# Patient Record
Sex: Female | Born: 2000 | Race: White | Hispanic: No | Smoking: Never smoker
Health system: Southern US, Community
[De-identification: ages and names within clinical notes are randomized; demographics above are authoritative.]

## PROBLEM LIST (undated history)

## (undated) DIAGNOSIS — T7840XA Allergy, unspecified, initial encounter: Secondary | ICD-10-CM

## (undated) HISTORY — PX: INNER EAR SURGERY: SHX679

## (undated) HISTORY — PX: TYMPANOSTOMY TUBE PLACEMENT: SHX32

## (undated) HISTORY — PX: OTHER SURGICAL HISTORY: SHX169

## (undated) HISTORY — DX: Allergy, unspecified, initial encounter: T78.40XA

---
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
## 2019-02-07 ENCOUNTER — Other Ambulatory Visit: Payer: Self-pay

## 2019-02-07 ENCOUNTER — Ambulatory Visit (INDEPENDENT_AMBULATORY_CARE_PROVIDER_SITE_OTHER): Payer: Commercial Managed Care - PPO | Admitting: Family

## 2019-02-07 ENCOUNTER — Encounter: Payer: Self-pay | Admitting: Family

## 2019-02-07 VITALS — BP 120/68 | HR 134 | Temp 98.4°F | Resp 16 | Ht 66.0 in | Wt 196.0 lb

## 2019-02-07 DIAGNOSIS — Z23 Encounter for immunization: Secondary | ICD-10-CM

## 2019-02-07 DIAGNOSIS — R000 Tachycardia, unspecified: Secondary | ICD-10-CM

## 2019-02-07 DIAGNOSIS — Z0000 Encounter for general adult medical examination without abnormal findings: Secondary | ICD-10-CM

## 2019-02-07 DIAGNOSIS — Z0001 Encounter for general adult medical examination with abnormal findings: Secondary | ICD-10-CM | POA: Diagnosis not present

## 2019-02-07 DIAGNOSIS — R030 Elevated blood-pressure reading, without diagnosis of hypertension: Secondary | ICD-10-CM

## 2019-02-07 NOTE — Patient Instructions (Addendum)
Please complete lab work prior to leaving. Please work on healthy low fat diet, regular exercise and weight loss.

## 2019-02-07 NOTE — Addendum Note (Signed)
Addended by: Jiles Prows on: 02/07/2019 02:47 PM   Modules accepted: Orders

## 2019-02-07 NOTE — Progress Notes (Signed)
Subjective:    Patient ID: Amber Whitney, female    DOB: Jul 14, 2001, 18 y.o.   MRN: [id number]  HPI  Patient presents today for complete physical.  Immunizations:  Up to date, declines gardisil. Would like meningococcal vaccine Diet:trying to eat healthy Wt Readings from Last 3 Encounters:  02/07/19 196 lb (88.9 kg) (97 %, Z= 1.90)*  11/19/17 186 lb 6.4 oz (84.6 kg) (96 %, Z= 1.79)*  08/20/16 167 lb (75.8 kg) (93 %, Z= 1.51)*   * Growth percentiles are based on CDC (Girls, 2-20 Years) data.  Exercise: works at [employer]  Pap Smear:  Will begin at age 69- not sexually active.  Vision: 2-3 weeks ago Dental:  1 month ago  Review of Systems  Constitutional: Positive for unexpected weight change.  HENT: Negative for hearing loss and rhinorrhea.   Eyes: Negative for visual disturbance.  Respiratory: Negative for cough.   Cardiovascular: Negative for leg swelling.  Gastrointestinal: Negative for blood in stool and diarrhea.  Genitourinary: Negative for dysuria, frequency and hematuria.  Musculoskeletal: Negative for arthralgias and myalgias.  Skin: Negative for rash.  Neurological: Negative for headaches.  Hematological: Negative for adenopathy.  Psychiatric/Behavioral:       Denies depression/anxiety   Past Medical History:  Diagnosis Date  . Allergy      Social History   Socioeconomic History  . Marital status: Single    Spouse name: Not on file  . Number of children: Not on file  . Years of education: Not on file  . Highest education level: Not on file  Occupational History  . Not on file  Social Needs  . Financial resource strain: Not on file  . Food insecurity    Worry: Not on file    Inability: Not on file  . Transportation needs    Medical: Not on file    Non-medical: Not on file  Tobacco Use  . Smoking status: Never Smoker  . Smokeless tobacco: Never Used  Substance and Sexual Activity  . Alcohol use: No    Alcohol/week: 0.0 standard drinks  . Drug  use: No  . Sexual activity: Not on file  Lifestyle  . Physical activity    Days per week: Not on file    Minutes per session: Not on file  . Stress: Not on file  Relationships  . Social Herbalist on phone: Not on file    Gets together: Not on file    Attends religious service: Not on file    Active member of club or organization: Not on file    Attends meetings of clubs or organizations: Not on file    Relationship status: Not on file  . Intimate partner violence    Fear of current or ex partner: Not on file    Emotionally abused: Not on file    Physically abused: Not on file    Forced sexual activity: Not on file  Other Topics Concern  . Not on file  Social History Narrative   Enjoys volleyball   Goes to [employer] group   Has one sister   Lives with parents   Rising freshman at [employer]   One dog.      Past Surgical History:  Procedure Laterality Date  . INNER EAR SURGERY Left    tubes in both ears and reconstructive surgery of left ear.    Family History  Problem Relation Age of Onset  . Migraines Mother   .  Hyperlipidemia Father   . Diabetes Paternal Uncle   . Heart disease Paternal Grandfather   . Stroke Paternal Grandfather   . Hypertension Paternal Grandfather   . Diabetes Maternal Grandfather     No Known Allergies  Current Outpatient Medications on File Prior to Visit  Medication Sig Dispense Refill  . cetirizine (ZYRTEC) 10 MG tablet Take 10 mg by mouth daily.    . pantoprazole (PROTONIX) 40 MG tablet TAKE 1 TABLET (40 MG TOTAL) BY MOUTH DAILY. (Patient not taking: Reported on 11/19/2017) 30 tablet 5  . rizatriptan (MAXALT-MLT) 5 MG disintegrating tablet Take 1 tablet (5 mg total) by mouth as needed for migraine. May repeat in 2 hours if needed (Patient not taking: Reported on 11/19/2017) 10 tablet 0  . sertraline (ZOLOFT) 50 MG tablet TAKE 1/2 TABLET BY MOUTH ONCE DAILY X1 WEEK THEN INCREASE TO 1 TABLET ONCE DAILY ON WEEK 2 (Patient not  taking: Reported on 11/19/2017) 30 tablet 5   No current facility-administered medications on file prior to visit.     BP 120/68   Pulse (!) 134   Temp 98.4 F (36.9 C) (Oral)   Resp 16   Ht 5\' 6"  (1.676 m)   Wt 196 lb (88.9 kg)   LMP 01/24/2019   SpO2 99%   BMI 31.64 kg/m       Objective:   Physical Exam  Physical Exam  Constitutional: She is oriented to person, place, and time. She appears well-developed and well-nourished. No distress.  HENT:  Head: Normocephalic and atraumatic.  Right Ear: Tympanic membrane and ear canal normal.  Left Ear: Tympanic membrane and ear canal normal.  Mouth/Throat: not examined.  Eyes: Pupils are equal, round, and reactive to light. No scleral icterus.  Neck: Normal range of motion. No thyromegaly present.  Cardiovascular: + tachycardia noted No murmur heard. Pulmonary/Chest: Effort normal and breath sounds normal. No respiratory distress. He has no wheezes. She has no rales. She exhibits no tenderness.  Abdominal: Soft. Bowel sounds are normal. She exhibits no distension and no mass. There is no tenderness. There is no rebound and no guarding.  Musculoskeletal: She exhibits no edema.  Lymphadenopathy:    She has no cervical adenopathy.  Neurological: She is alert and oriented to person, place, and time. She has normal patellar reflexes. She exhibits normal muscle tone. Coordination normal.  Skin: Skin is warm and dry.  Psychiatric: She has a normal mood and affect. Her behavior is normal. Judgment and thought content normal.  Breast/pelvic: deferred           Assessment & Plan:   Preventative care- discussed importance of healthy diet, exercise and weight loss.  Menveo #2 today. Would like bexero next visit. Obtain routine lab work.   Elevated blood pressure reading- repeat BP OK.  Will plan to recheck in 6 months.    Tachycardia- HR 134 upon arrival. Repeat HR 124.  EKG Is performed and personally reviewed. Notes sinus  tachycardia- she notes feeling mildly nervous which may be contributing. Note is also made of short PRI (110 msec). Will refer to cardiology.      Assessment & Plan:

## 2019-02-08 LAB — CBC WITH DIFFERENTIAL/PLATELET
Basophils Absolute: 0.1 10*3/uL (ref 0.0–0.1)
Basophils Relative: 1.1 % (ref 0.0–3.0)
Eosinophils Absolute: 0.2 10*3/uL (ref 0.0–0.7)
Eosinophils Relative: 1.6 % (ref 0.0–5.0)
HCT: 42.1 % (ref 36.0–49.0)
Hemoglobin: 13.9 g/dL (ref 12.0–16.0)
Lymphocytes Relative: 22.6 % — ABNORMAL LOW (ref 24.0–48.0)
Lymphs Abs: 2.6 10*3/uL (ref 0.7–4.0)
MCHC: 33 g/dL (ref 31.0–37.0)
MCV: 84.3 fl (ref 78.0–98.0)
Monocytes Absolute: 0.6 10*3/uL (ref 0.1–1.0)
Monocytes Relative: 5.5 % (ref 3.0–12.0)
Neutro Abs: 7.9 10*3/uL — ABNORMAL HIGH (ref 1.4–7.7)
Neutrophils Relative %: 69.2 % (ref 43.0–71.0)
Platelets: 273 10*3/uL (ref 150.0–575.0)
RBC: 5 Mil/uL (ref 3.80–5.70)
RDW: 13.3 % (ref 11.4–15.5)
WBC: 11.4 10*3/uL (ref 4.5–13.5)

## 2019-02-08 LAB — LIPID PANEL
Cholesterol: 157 mg/dL (ref 0–200)
HDL: 60.5 mg/dL (ref >39.00)
LDL Cholesterol: 74 mg/dL (ref 0–99)
NonHDL: 96.03
Total CHOL/HDL Ratio: 3
Triglycerides: 110 mg/dL (ref 0.0–149.0)
VLDL: 22 mg/dL (ref 0.0–40.0)

## 2019-02-08 LAB — BASIC METABOLIC PANEL
BUN: 14 mg/dL (ref 6–23)
CO2: 24 mEq/L (ref 19–32)
Calcium: 10.1 mg/dL (ref 8.4–10.5)
Chloride: 104 mEq/L (ref 96–112)
Creatinine, Ser: 0.71 mg/dL (ref 0.40–1.20)
GFR: 106.01 mL/min (ref >60.00)
Glucose, Bld: 117 mg/dL — ABNORMAL HIGH (ref 70–99)
Potassium: 4.3 mEq/L (ref 3.5–5.1)
Sodium: 138 mEq/L (ref 135–145)

## 2019-02-08 LAB — TSH: TSH: 2.65 u[IU]/mL (ref 0.40–5.00)

## 2019-02-08 LAB — HEPATIC FUNCTION PANEL
ALT: 24 U/L (ref 0–35)
AST: 15 U/L (ref 0–37)
Albumin: 4.8 g/dL (ref 3.5–5.2)
Alkaline Phosphatase: 74 U/L (ref 47–119)
Bilirubin, Direct: 0.1 mg/dL (ref 0.0–0.3)
Total Bilirubin: 0.4 mg/dL (ref 0.2–1.2)
Total Protein: 7.4 g/dL (ref 6.0–8.3)

## 2019-02-09 ENCOUNTER — Other Ambulatory Visit (INDEPENDENT_AMBULATORY_CARE_PROVIDER_SITE_OTHER): Payer: Commercial Managed Care - PPO

## 2019-02-09 DIAGNOSIS — R739 Hyperglycemia, unspecified: Secondary | ICD-10-CM | POA: Diagnosis not present

## 2019-02-09 LAB — HEMOGLOBIN A1C: Hgb A1c MFr Bld: 5.3 % (ref 4.6–6.5)

## 2019-02-10 ENCOUNTER — Encounter: Payer: Self-pay | Admitting: Family

## 2019-02-13 ENCOUNTER — Telehealth: Payer: Self-pay | Admitting: Family

## 2019-02-13 DIAGNOSIS — R000 Tachycardia, unspecified: Secondary | ICD-10-CM

## 2019-02-13 DIAGNOSIS — R9431 Abnormal electrocardiogram [ECG] [EKG]: Secondary | ICD-10-CM

## 2019-02-13 NOTE — Telephone Encounter (Signed)
Talked to patient's mother and advised her  per [facility] the referral was submitted last week and she can give them a call to schedule Phone number given to patient's mother. Advised to let us know if she is not able to contact or doesn't hear from them by Wednesday

## 2019-02-13 NOTE — Telephone Encounter (Signed)
Please let mother know that we are working on this and we apologize for the delay.  Please let me know if she has not heard from cardiology by the end of the day on Wednesday.

## 2019-02-13 NOTE — Telephone Encounter (Signed)
Mother checking on the status of Cardiology referral, mother states referral was discussed at 02/07/2019 appointment. Mother would like a follow up call today, please advise  Deneane Stifter tel [phone]

## 2019-02-13 NOTE — Telephone Encounter (Signed)
Please advise 

## 2019-02-15 DIAGNOSIS — R9431 Abnormal electrocardiogram [ECG] [EKG]: Secondary | ICD-10-CM | POA: Insufficient documentation

## 2019-02-15 NOTE — Progress Notes (Signed)
Cardiology Office Note:    Date:  02/16/2019   ID:  Amber Whitney, DOB 03/01/2001, MRN [id number]  PCP:  Sandford Craze'Sullivan, Melissa, NP  Cardiologist:  Norman HerrlichBrian , MD   Referring MD: Sandford Craze'Sullivan, Melissa, NP  ASSESSMENT:    1. Sinus tachycardia   2. Abnormal EKG    PLAN:    In order of problems listed above:  1. General routine visit was not to have tachycardia EKG showed sinus tach 124 bpm with accelerated AV nodal conduction.  Today's EKG shows a rate of 90 and a normal PR interval.  Patient and her mother are very concerned about the tachycardia there is no obvious etiology her hemoglobin and thyroid is normal she is taking no over-the-counter medicines to accelerate her rate and after a nice discussion and shared decision making she will undergo echocardiogram to exclude cardiomyopathy structural heart disease and a 3-day ZIO monitor to assess her heart rates.  She relates she is less physically active than COVID-19 and this may be a reflection of deconditioning.  Next appointment 6 weeks   Medication Adjustments/Labs and Tests Ordered: Current medicines are reviewed at length with the patient today.  Concerns regarding medicines are outlined above.  No orders of the defined types were placed in this encounter.  No orders of the defined types were placed in this encounter.    Chief Complaint  Patient presents with  . Abnormal ECG    History of Present Illness:    Amber Whitney is a 18 y.o. female who is being seen today for the evaluation of an abnormal EKG at the request of Sandford Craze'Sullivan, Melissa, NP. Office EKG 02/07/2019 shows sinus tachycardia accelerated AV nodal conduction without preexcitation and biatrial enlargement. She has no history of heart disease heart murmur and has had no shortness of breath palpitations syncope or chest pain.  Her uncle had a myocardial infarction and died in his 7140s and her great-grandmother had rheumatic fever.  She takes no over-the-counter  proarrhythmic drugs and evaluation shows a normal hemoglobin normal thyroid studies and takes no proarrhythmic medications.  After a nice discussion with the patient and her mother and shared decision making she will further evaluation including echocardiogram to exclude structural heart disease and 3-day ZIO monitor to assess heart rhythm and heart rate.  I told him my expectation that these will be normal and have not restricted activity.  Accelerated AV nodal conduction does not put her at risk for sustained tachyarrhythmia.  There is no evidence of preexcitation on the EKG Past Medical History:  Diagnosis Date  . Allergy     Past Surgical History:  Procedure Laterality Date  . INNER EAR SURGERY Left    tubes in both ears and reconstructive surgery of left ear.    Current Medications: Current Meds  Medication Sig  . cetirizine (ZYRTEC) 10 MG tablet Take 10 mg by mouth daily.     Allergies:   Patient has no known allergies.   Social History   Socioeconomic History  . Marital status: Single    Spouse name: Not on file  . Number of children: Not on file  . Years of education: Not on file  . Highest education level: Not on file  Occupational History  . Not on file  Social Needs  . Financial resource strain: Not on file  . Food insecurity    Worry: Not on file    Inability: Not on file  . Transportation needs    Medical: Not on file  Non-medical: Not on file  Tobacco Use  . Smoking status: Never Smoker  . Smokeless tobacco: Never Used  Substance and Sexual Activity  . Alcohol use: No    Alcohol/week: 0.0 standard drinks  . Drug use: No  . Sexual activity: Not on file  Lifestyle  . Physical activity    Days per week: Not on file    Minutes per session: Not on file  . Stress: Not on file  Relationships  . Social Herbalist on phone: Not on file    Gets together: Not on file    Attends religious service: Not on file    Active member of club or  organization: Not on file    Attends meetings of clubs or organizations: Not on file    Relationship status: Not on file  Other Topics Concern  . Not on file  Social History Narrative   Enjoys volleyball   Goes to [employer] group   Has one sister   Lives with parents   Rising freshman at [employer]   One dog.       Family History: The patient's family history includes Diabetes in her maternal grandfather and paternal uncle; Heart disease in her paternal grandfather; Hyperlipidemia in her father; Hypertension in her paternal grandfather; Migraines in her mother; Stroke in her paternal grandfather.  ROS:   Review of Systems  Eyes: Negative.   Cardiovascular: Negative.   Respiratory: Negative.   Endocrine: Negative.    Please see the history of present illness.     All other systems reviewed and are negative.  EKGs/Labs/Other Studies Reviewed:    The following studies were reviewed today:   EKG:  EKG is  ordered today.  The ekg ordered today is personally reviewed and demonstrates [place] 90 BPM normal for age  Recent Labs: 02/07/2019: ALT 24; BUN 14; Creatinine, Ser 0.71; Hemoglobin 13.9; Platelets 273.0; Potassium 4.3; Sodium 138; TSH 2.65  Recent Lipid Panel    Component Value Date/Time   CHOL 157 02/07/2019 1426   TRIG 110.0 02/07/2019 1426   HDL 60.50 02/07/2019 1426   CHOLHDL 3 02/07/2019 1426   VLDL 22.0 02/07/2019 1426   LDLCALC 74 02/07/2019 1426   LDLCALC 64 11/19/2017 1444    Physical Exam:    VS:  BP 112/78 (BP Location: Left Arm, Patient Position: Sitting, Cuff Size: Large)   Pulse 90   Temp 98.4 F (36.9 C)   Ht 5\' 6"  (1.676 m)   Wt 198 lb 12.8 oz (90.2 kg)   LMP 01/24/2019   BMI 32.09 kg/m     Wt Readings from Last 3 Encounters:  02/16/19 198 lb 12.8 oz (90.2 kg) (97 %, Z= 1.94)*  02/07/19 196 lb (88.9 kg) (97 %, Z= 1.90)*  11/19/17 186 lb 6.4 oz (84.6 kg) (96 %, Z= 1.79)*   * Growth percentiles are based on CDC (Girls, 2-20 Years) data.      GEN:  Well nourished, well developed in no acute distress HEENT: Normal NECK: No JVD; No carotid bruits LYMPHATICS: No lymphadenopathy CARDIAC: RRR, no murmurs, rubs, gallops RESPIRATORY:  Clear to auscultation without rales, wheezing or rhonchi  ABDOMEN: Soft, non-tender, non-distended MUSCULOSKELETAL:  No edema; No deformity  SKIN: Warm and dry NEUROLOGIC:  Alert and oriented x 3 PSYCHIATRIC:  Normal affect     Signed, Shirlee More, MD  02/16/2019 9:42 AM    [place]

## 2019-02-16 ENCOUNTER — Encounter: Payer: Self-pay | Admitting: Cardiology

## 2019-02-16 ENCOUNTER — Ambulatory Visit (INDEPENDENT_AMBULATORY_CARE_PROVIDER_SITE_OTHER): Payer: Commercial Managed Care - PPO | Admitting: Cardiology

## 2019-02-16 ENCOUNTER — Other Ambulatory Visit: Payer: Self-pay

## 2019-02-16 VITALS — BP 112/78 | HR 90 | Temp 98.4°F | Ht 66.0 in | Wt 198.8 lb

## 2019-02-16 DIAGNOSIS — R9431 Abnormal electrocardiogram [ECG] [EKG]: Secondary | ICD-10-CM

## 2019-02-16 DIAGNOSIS — R000 Tachycardia, unspecified: Secondary | ICD-10-CM | POA: Diagnosis not present

## 2019-02-16 NOTE — Patient Instructions (Signed)
Medication Instructions:  Your physician recommends that you continue on your current medications as directed. Please refer to the Current Medication list given to you today.  If you need a refill on your cardiac medications before your next appointment, please call your pharmacy.   La*b work:  NOne If you have labs (blood work) drawn today and your tests are completely normal, you will receive your results only by: Marland Kitchen MyChart Message (if you have MyChart) OR . A paper copy in the mail If you have any lab test that is abnormal or we need to change your treatment, we will call you to review the results.  Testing/Procedures: Your physician has requested that you have an echocardiogram. Echocardiography is a painless test that uses sound waves to create images of your heart. It provides your doctor with information about the size and shape of your heart and how well your heart's chambers and valves are working. This procedure takes approximately one hour. There are no restrictions for this procedure.  Your physician has recommended that you wear a ZIO monitor. ZIO monitors are medical devices that record the heart's electrical activity. Doctors most often use these monitors to diagnose arrhythmias. Arrhythmias are problems with the speed or rhythm of the heartbeat. The monitor is a small, portable device. You can wear one while you do your normal daily activities. This is usually used to diagnose what is causing palpitations/syncope (passing out).  Wear 3 days  Follow-Up: At [facility], you and your health needs are our priority.  As part of our continuing mission to provide you with exceptional heart care, we have created designated Provider Care Teams.  These Care Teams include your primary Cardiologist (physician) and Advanced Practice Providers (APPs -  Physician Assistants and Nurse Practitioners) who all work together to provide you with the care you need, when you need it. You will need a  follow up appointment in 4 weeks.   Any Other Special Instructions Will Be Listed Below (If Applicable).   Echocardiogram An echocardiogram is a procedure that uses painless sound waves (ultrasound) to produce an image of the heart. Images from an echocardiogram can provide important information about:  Signs of coronary artery disease (CAD).  Aneurysm detection. An aneurysm is a weak or damaged part of an artery wall that bulges out from the normal force of blood pumping through the body.  Heart size and shape. Changes in the size or shape of the heart can be associated with certain conditions, including heart failure, aneurysm, and CAD.  Heart muscle function.  Heart valve function.  Signs of a past heart attack.  Fluid buildup around the heart.  Thickening of the heart muscle.  A tumor or infectious growth around the heart valves. Tell a health care provider about:  Any allergies you have.  All medicines you are taking, including vitamins, herbs, eye drops, creams, and over-the-counter medicines.  Any blood disorders you have.  Any surgeries you have had.  Any medical conditions you have.  Whether you are pregnant or may be pregnant. What are the risks? Generally, this is a safe procedure. However, problems may occur, including:  Allergic reaction to dye (contrast) that may be used during the procedure. What happens before the procedure? No specific preparation is needed. You may eat and drink normally. What happens during the procedure?   An IV tube may be inserted into one of your veins.  You may receive contrast through this tube. A contrast is an injection that  improves the quality of the pictures from your heart.  A gel will be applied to your chest.  A wand-like tool (transducer) will be moved over your chest. The gel will help to transmit the sound waves from the transducer.  The sound waves will harmlessly bounce off of your heart to allow the heart  images to be captured in real-time motion. The images will be recorded on a computer. The procedure may vary among health care providers and hospitals. What happens after the procedure?  You may return to your normal, everyday life, including diet, activities, and medicines, unless your health care provider tells you not to do that. Summary  An echocardiogram is a procedure that uses painless sound waves (ultrasound) to produce an image of the heart.  Images from an echocardiogram can provide important information about the size and shape of your heart, heart muscle function, heart valve function, and fluid buildup around your heart.  You do not need to do anything to prepare before this procedure. You may eat and drink normally.  After the echocardiogram is completed, you may return to your normal, everyday life, unless your health care provider tells you not to do that. This information is not intended to replace advice given to you by your health care provider. Make sure you discuss any questions you have with your health care provider. Document Released: 07/10/2000 Document Revised: 11/03/2018 Document Reviewed: 08/15/2016 Elsevier Patient Education  2020 [employer].

## 2019-02-28 ENCOUNTER — Other Ambulatory Visit: Payer: Self-pay

## 2019-02-28 ENCOUNTER — Ambulatory Visit (HOSPITAL_BASED_OUTPATIENT_CLINIC_OR_DEPARTMENT_OTHER)
Admission: RE | Admit: 2019-02-28 | Discharge: 2019-02-28 | Disposition: A | Discharge status: Home / Self Care | Payer: Commercial Managed Care - PPO | Source: Ambulatory Visit | Attending: Cardiology | Admitting: Cardiology

## 2019-02-28 ENCOUNTER — Other Ambulatory Visit (INDEPENDENT_AMBULATORY_CARE_PROVIDER_SITE_OTHER): Payer: Commercial Managed Care - PPO

## 2019-02-28 DIAGNOSIS — R000 Tachycardia, unspecified: Secondary | ICD-10-CM

## 2019-02-28 DIAGNOSIS — R9431 Abnormal electrocardiogram [ECG] [EKG]: Secondary | ICD-10-CM | POA: Insufficient documentation

## 2019-02-28 NOTE — Progress Notes (Signed)
  Echocardiogram 2D Echocardiogram has been performed.  Amber Whitney 02/28/2019, 2:59 PM

## 2019-08-07 ENCOUNTER — Other Ambulatory Visit: Payer: Commercial Managed Care - PPO

## 2019-08-07 ENCOUNTER — Ambulatory Visit: Payer: Commercial Managed Care - PPO | Attending: Internal Medicine

## 2019-08-07 DIAGNOSIS — Z20822 Contact with and (suspected) exposure to covid-19: Secondary | ICD-10-CM

## 2019-08-09 LAB — NOVEL CORONAVIRUS, NAA: SARS-CoV-2, NAA: NOT DETECTED

## 2020-07-17 ENCOUNTER — Other Ambulatory Visit: Payer: Self-pay

## 2020-07-17 ENCOUNTER — Ambulatory Visit: Payer: Commercial Managed Care - PPO | Admitting: Family

## 2020-07-17 ENCOUNTER — Telehealth: Payer: Self-pay | Admitting: Family

## 2020-07-17 VITALS — BP 121/69 | HR 107 | Temp 98.4°F | Resp 16 | Ht 66.5 in | Wt 195.0 lb

## 2020-07-17 DIAGNOSIS — F419 Anxiety disorder, unspecified: Secondary | ICD-10-CM | POA: Diagnosis not present

## 2020-07-17 DIAGNOSIS — R000 Tachycardia, unspecified: Secondary | ICD-10-CM

## 2020-07-17 MED ORDER — SERTRALINE HCL 50 MG PO TABS: ORAL_TABLET | ORAL | 0 refills | Status: DC

## 2020-07-17 NOTE — Progress Notes (Signed)
Subjective:    Patient ID: Amber Whitney, female    DOB: 2001/03/27, 19 y.o.   MRN: [id number]  HPI  Patient is a 18 year old female who presents today to discuss concerns about anxiety. 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 Feels like anxiety is triggered about worrying about throwing up and embarrassing herself in front of others.  Other times it is "irrational thoughts."  Sometimes she has nausea as a result of her anxiety.  Recently at school she went to see a nutcracker performance. She got  really panicky when sitting in the crowd.   Tachycardia-she did see cardiology back in August 2020.  She completed a 3-day ZIO monitor and this did not indicate any arrhythmia.  A 2D echo was ordered and she states that she completed this but the result is not available to me in Epic.   Review of Systems    see HPI  Past Medical History:  Diagnosis Date   Allergy      Social History   Socioeconomic History   Marital status: Single    Spouse name: Not on file   Number of children: Not on file   Years of education: Not on file   Highest education level: Not on file  Occupational History   Not on file  Tobacco Use   Smoking status: Never Smoker   Smokeless tobacco: Never Used  Vaping Use   Vaping Use: Never used  Substance and Sexual Activity   Alcohol use: No    Alcohol/week: 0.0 standard drinks   Drug use: No   Sexual activity: Not on file  Other Topics Concern   Not on file  Social History Narrative   Enjoys volleyball   Goes to [employer] group   Has one sister   Lives with parents   Rising freshman at [employer]   One dog.     Social Determinants of Health   Financial Resource Strain: Not on file  Food Insecurity: Not on file  Transportation Needs: Not on file  Physical Activity: Not on file  Stress: Not on file  Social Connections: Not on file  Intimate Partner Violence: Not on file    Past Surgical  History:  Procedure Laterality Date   INNER EAR SURGERY Left    tubes in both ears and reconstructive surgery of left ear.    Family History  Problem Relation Age of Onset   Migraines Mother    Hyperlipidemia Father    Diabetes Paternal Uncle    Heart disease Paternal Grandfather    Stroke Paternal Grandfather    Hypertension Paternal Grandfather    Diabetes Maternal Grandfather     No Known Allergies  Current Outpatient Medications on File Prior to Visit  Medication Sig Dispense Refill   cetirizine (ZYRTEC) 10 MG tablet Take 10 mg by mouth daily.     No current facility-administered medications on file prior to visit.    BP 121/69 (BP Location: Right Arm, Patient Position: Sitting, Cuff Size: Large)    Pulse (!) 107    Temp 98.4 F (36.9 C) (Oral)    Resp 16    Ht 5' 6.5" (1.689 m)    Wt 195 lb (88.5 kg)    SpO2 99%    BMI 31.00 kg/m    Objective:   Physical Exam Constitutional:      Appearance: She is well-developed and well-nourished.  Neck:     Thyroid: No thyromegaly.  Cardiovascular:     Rate and Rhythm: Normal rate and regular rhythm.     Heart sounds: Normal heart sounds. No murmur heard.   Pulmonary:     Effort: Pulmonary effort is normal. No respiratory distress.     Breath sounds: Normal breath sounds. No wheezing.  Musculoskeletal:     Cervical back: Neck supple.  Skin:    General: Skin is warm and dry.  Neurological:     Mental Status: She is alert and oriented to person, place, and time.  Psychiatric:        Mood and Affect: Mood and affect normal.        Behavior: Behavior normal.        Thought Content: Thought content normal.        Judgment: Judgment normal.           Assessment & Plan:  Anxiety- uncontrolled. She has been on zoloft in the past and tolerated without difficulty. Noted it was helpful and she wishes to restart.  Advised pt as follows:  Begin zoloft 50mg  1/2  Tab once daily for 1 week, then increase to a full  tab on week two.   I have also encouraged her to establish with a counselor when she returns to school.  Tachycardia- normal ZIO monitor. Will check with cardiology re: results of echo.  Patient believes that her tachycardia is anxiety related.  Monitor.  This visit occurred during the SARS-CoV-2 public health emergency.  Safety protocols were in place, including screening questions prior to the visit, additional usage of staff PPE, and extensive cleaning of exam room while observing appropriate contact time as indicated for disinfecting solutions.

## 2020-07-17 NOTE — Telephone Encounter (Signed)
-----   Message from Baldo Daub, MD sent at 07/17/2020 12:30 PM EST ----- Yes, I copied the findings.   IMPRESSIONS    1. The left ventricle has normal systolic function with an ejection  fraction of 60-65%. The cavity size was normal. Left ventricular diastolic  parameters were normal.  2. The right ventricle has normal systolic function. The cavity was  normal. There is no increase in right ventricular wall thickness.  3. The mitral valve is grossly normal.  4. The tricuspid valve is grossly normal.  5. The aortic valve is grossly normal. Aortic valve regurgitation was not  assessed by color flow Doppler.  6. The aorta is normal in size and structure.    ----- Message ----- From: Sandford Craze, NP Sent: 07/17/2020  12:23 PM EST To: Baldo Daub, MD  Hi Dr. Dulce Sellar,  I saw this pt today ( you saw her back in 2020 for tachycardia). She says she completed the echo that you ordered, however when I click  on the echo report 8/4, all I can see is an EKG?  Are you able to see if this was completed in your echo system?   Thanks so much and hope you have a Merry Christmas!  [name]

## 2020-07-17 NOTE — Patient Instructions (Signed)
Please begin zoloft 50mg  1/2  Tab once daily for 1 week, then increase to a full tab on week two.

## 2020-07-28 ENCOUNTER — Encounter: Payer: Self-pay | Admitting: Family

## 2020-07-29 ENCOUNTER — Other Ambulatory Visit: Payer: Self-pay | Admitting: Family

## 2020-07-29 MED ORDER — ESCITALOPRAM OXALATE 10 MG PO TABS
10.0000 mg | ORAL_TABLET | Freq: Every day | ORAL | 0 refills | Status: DC
Start: 2020-07-29 — End: 2020-08-21

## 2020-08-17 ENCOUNTER — Telehealth: Payer: Commercial Managed Care - PPO | Admitting: Emergency Medicine

## 2020-08-17 DIAGNOSIS — R110 Nausea: Secondary | ICD-10-CM

## 2020-08-17 NOTE — Progress Notes (Signed)
Patient needing follow-up with PCP to review treatment plan on Lexapro.  Patient mistakenly thought e-visit would work for follow-up visit with PCP.  Referred back to PCP.  Encounter cancelled for e-visit.

## 2020-08-21 ENCOUNTER — Other Ambulatory Visit: Payer: Self-pay

## 2020-08-21 ENCOUNTER — Telehealth (INDEPENDENT_AMBULATORY_CARE_PROVIDER_SITE_OTHER): Payer: Commercial Managed Care - PPO | Admitting: Family

## 2020-08-21 ENCOUNTER — Telehealth: Payer: Commercial Managed Care - PPO | Admitting: Family

## 2020-08-21 DIAGNOSIS — F419 Anxiety disorder, unspecified: Secondary | ICD-10-CM | POA: Diagnosis not present

## 2020-08-21 MED ORDER — ESCITALOPRAM OXALATE 10 MG PO TABS: 10.0000 mg | ORAL_TABLET | Freq: Every day | ORAL | 1 refills | Status: DC

## 2020-08-21 NOTE — Progress Notes (Signed)
Virtual Visit via Video Note  I connected with Amber Whitney on 08/21/20 at  9:20 AM EST by a video enabled telemedicine application and verified that I am speaking with the correct person using two identifiers.  Location: Patient: college dorm room Provider: home   I discussed the limitations of evaluation and management by telemedicine and the availability of in person appointments. The patient expressed understanding and agreed to proceed. Only the patient and myself were present for today's video call.   History of Present Illness:  Patient is a 20 yr old female who presents today for follow up of her anxiety.  She reported excessive worry and some irrational thoughts last visit.  We gave her a trial of zoloft. She took it for a few days but developed increased anxiety symptoms. We then changed her to lexapro. She reports that since beginning lexapro, she has not had any further panic attacks. She is able to enter a large room of people without having any anxiety.  Her nausea has completely resolved.  She denies depression symptoms. She reports that her school work is  She is now on lexapro.  No nausea denies depression.  She does not have access to a scale at school but states that her clothes are fitting the same.    Wt Readings from Last 3 Encounters:  07/17/20 195 lb (88.5 kg)  02/16/19 198 lb 12.8 oz (90.2 kg) (97 %, Z= 1.94)*  02/07/19 196 lb (88.9 kg) (97 %, Z= 1.90)*   * Growth percentiles are based on CDC (Girls, 2-20 Years) data.      Observations/Objective:   Gen: Awake, alert, no acute distress Resp: Breathing is even and non-labored Psych: calm/pleasant demeanor Neuro: Alert and Oriented x 3, + facial symmetry, speech is clear.   Assessment and Plan:  Anxiety- significant improvement on lexpro. Tolerating without side effect. Recommend that she continue current dose and to follow up in 3-4 months.   Follow Up Instructions:    I discussed the assessment  and treatment plan with the patient. The patient was provided an opportunity to ask questions and all were answered. The patient agreed with the plan and demonstrated an understanding of the instructions.   The patient was advised to call back or seek an in-person evaluation if the symptoms worsen or if the condition fails to improve as anticipated.  Lemont Fillers, NP

## 2020-09-24 HISTORY — PX: OTHER SURGICAL HISTORY: SHX169

## 2021-02-07 ENCOUNTER — Other Ambulatory Visit (HOSPITAL_COMMUNITY)
Admission: RE | Admit: 2021-02-07 | Discharge: 2021-02-07 | Disposition: A | Discharge status: Home / Self Care | Payer: Commercial Managed Care - PPO | Source: Ambulatory Visit | Attending: Family | Admitting: Family

## 2021-02-07 ENCOUNTER — Other Ambulatory Visit: Payer: Self-pay

## 2021-02-07 ENCOUNTER — Encounter: Payer: Self-pay | Admitting: Family

## 2021-02-07 ENCOUNTER — Ambulatory Visit (INDEPENDENT_AMBULATORY_CARE_PROVIDER_SITE_OTHER): Payer: Commercial Managed Care - PPO | Admitting: Family

## 2021-02-07 VITALS — BP 118/88 | HR 121 | Temp 98.9°F | Resp 18 | Ht 65.5 in | Wt 189.4 lb

## 2021-02-07 DIAGNOSIS — Z30011 Encounter for initial prescription of contraceptive pills: Secondary | ICD-10-CM | POA: Insufficient documentation

## 2021-02-07 DIAGNOSIS — R739 Hyperglycemia, unspecified: Secondary | ICD-10-CM

## 2021-02-07 DIAGNOSIS — E663 Overweight: Secondary | ICD-10-CM

## 2021-02-07 DIAGNOSIS — F411 Generalized anxiety disorder: Secondary | ICD-10-CM

## 2021-02-07 DIAGNOSIS — Z309 Encounter for contraceptive management, unspecified: Secondary | ICD-10-CM | POA: Insufficient documentation

## 2021-02-07 DIAGNOSIS — Z0000 Encounter for general adult medical examination without abnormal findings: Secondary | ICD-10-CM | POA: Insufficient documentation

## 2021-02-07 LAB — COMPREHENSIVE METABOLIC PANEL
ALT: 41 U/L — ABNORMAL HIGH (ref 0–35)
AST: 19 U/L (ref 0–37)
Albumin: 5 g/dL (ref 3.5–5.2)
Alkaline Phosphatase: 97 U/L (ref 39–117)
BUN: 13 mg/dL (ref 6–23)
CO2: 24 mEq/L (ref 19–32)
Calcium: 10.2 mg/dL (ref 8.4–10.5)
Chloride: 102 mEq/L (ref 96–112)
Creatinine, Ser: 0.73 mg/dL (ref 0.40–1.20)
GFR: 117.88 mL/min (ref >60.00)
Glucose, Bld: 86 mg/dL (ref 70–99)
Potassium: 4 mEq/L (ref 3.5–5.1)
Sodium: 138 mEq/L (ref 135–145)
Total Bilirubin: 0.6 mg/dL (ref 0.2–1.2)
Total Protein: 7.8 g/dL (ref 6.0–8.3)

## 2021-02-07 LAB — LIPID PANEL
Cholesterol: 183 mg/dL (ref 0–200)
HDL: 56 mg/dL (ref >39.00)
LDL Cholesterol: 112 mg/dL — ABNORMAL HIGH (ref 0–99)
NonHDL: 126.7
Total CHOL/HDL Ratio: 3
Triglycerides: 74 mg/dL (ref 0.0–149.0)
VLDL: 14.8 mg/dL (ref 0.0–40.0)

## 2021-02-07 MED ORDER — NORETHINDRONE ACET-ETHINYL EST 1.5-30 MG-MCG PO TABS: 1.0000 | ORAL_TABLET | Freq: Every day | ORAL | 4 refills | Status: DC

## 2021-02-07 NOTE — Patient Instructions (Signed)
Please complete lab work prior to leaving.   

## 2021-02-07 NOTE — Assessment & Plan Note (Signed)
Remains stable on lexapro 10mg  once daily.

## 2021-02-07 NOTE — Assessment & Plan Note (Signed)
New. Urine Hcg, if negative, start OCP tonight.

## 2021-02-07 NOTE — Assessment & Plan Note (Addendum)
Wt Readings from Last 3 Encounters:  02/07/21 189 lb 6.4 oz (85.9 kg)  07/17/20 195 lb (88.5 kg)  02/16/19 198 lb 12.8 oz (90.2 kg) (97 %, Z= 1.94)*   * Growth percentiles are based on CDC (Girls, 2-20 Years) data.  Continue healthy diet/exercise.   Discussed diet, exercise and weight loss. She is requesting a vit D level.  Pap with chlamydia screening today. Declines gardisil. Offered nystatin powder for beneath her breasts. She declines.

## 2021-02-07 NOTE — Progress Notes (Signed)
Subjective:   By signing my name below, I, Amber Whitney, attest that this documentation has been prepared under the direction and in the presence of Amber Alar NP. 02/07/2021    Patient ID: Amber Whitney, female    DOB: 2001/02/27, 20 y.o.   MRN: [id number]  Chief Complaint  Patient presents with   Annual Exam    Pt states fasting.     HPI Patient is in today for a comprehensive physical exam. She had a ankle procedure this past year, otherwise she has no changes to her surgical history. She has no new changes to her family history. She does not drink alcohol. She does not use drugs. She does not smoke or use vape products. She is not sexually active at this time.  Menstrual cycle- She is interested in taking birth control medication. She has not taken birth control in the past. She reports her menstrual cycles are regular and consistent and that her last one was last week. Ankle- She broke her Ankle in January and had to have surgery. She is still in a brace. She reports that her ankle continues having mild pain while walking. This is being managed by Ortho (Dr. Mardelle Matte) Anxiety- She reports doing well on 10 mg lexapro daily PO.   Immunizations: She is not interested in getting the gardasil vaccine at this time. She is not interested in getting a HIV or hepatitis screening at this time. She has 3 pfizer Covid-19 vaccines. Pap Smear- She is due for a pap smear and is willing to set up an appointment. Diet: She is managing a healthy diet. Exercise: She does not participate in regular exercise since breaking her ankle in February, 2022. Dental: She is UTD on dental care. Vision: She is UTD on vision care.  Health Maintenance Due  Topic Date Due   PAP-Cervical Cytology Screening  01/26/2021   PAP SMEAR-Modifier  01/26/2021    Past Medical History:  Diagnosis Date   Allergy     Past Surgical History:  Procedure Laterality Date   INNER EAR SURGERY Left    tubes in both  ears and reconstructive surgery of left ear.   right ankle surgery Right     Family History  Problem Relation Age of Onset   Migraines Mother    Hyperlipidemia Father    Diabetes Maternal Grandfather    Lung disease Paternal Grandmother        ? etiology   Heart disease Paternal Grandfather    Stroke Paternal Grandfather    Hypertension Paternal Grandfather    Diabetes Paternal Uncle     Social History   Socioeconomic History   Marital status: Single    Spouse name: Not on file   Number of children: Not on file   Years of education: Not on file   Highest education level: Not on file  Occupational History   Not on file  Tobacco Use   Smoking status: Never   Smokeless tobacco: Never  Vaping Use   Vaping Use: Never used  Substance and Sexual Activity   Alcohol use: No    Alcohol/week: 0.0 standard drinks   Drug use: No   Sexual activity: Not Currently    Partners: Male  Other Topics Concern   Not on file  Social History Narrative   Enjoys volleyball   Goes to [employer] group   Has one sister   Lives with parents   Rising freshman at [employer]   One dog.  Social Determinants of Health   Financial Resource Strain: Not on file  Food Insecurity: Not on file  Transportation Needs: Not on file  Physical Activity: Not on file  Stress: Not on file  Social Connections: Not on file  Intimate Partner Violence: Not on file    Outpatient Medications Prior to Visit  Medication Sig Dispense Refill   cetirizine (ZYRTEC) 10 MG tablet Take 10 mg by mouth daily.     escitalopram (LEXAPRO) 10 MG tablet Take 1 tablet (10 mg total) by mouth daily. 90 tablet 1   No facility-administered medications prior to visit.    No Known Allergies  Review of Systems  Constitutional:        (-)Unexpected weight change (-)Adenopathy  HENT:  Negative for hearing loss.        (-)rhinorrhea  Eyes:        (-)visual disturbance   Respiratory:  Negative for cough.   Cardiovascular:   Negative for chest pain and leg swelling.  Gastrointestinal:  Negative for blood in stool, diarrhea, nausea and vomiting.  Genitourinary:  Negative for dysuria and frequency.  Musculoskeletal:  Negative for joint pain and myalgias.  Skin:  Negative for rash.  Neurological:  Negative for headaches.  Psychiatric/Behavioral:  Negative for depression. The patient is not nervous/anxious.       Objective:    Physical Exam Constitutional:      General: She is not in acute distress.    Appearance: Normal appearance. She is not ill-appearing.  HENT:     Head: Normocephalic and atraumatic.     Right Ear: Tympanic membrane, ear canal and external ear normal.     Left Ear: Tympanic membrane, ear canal and external ear normal.     Ears:     Comments: Mild cerumen noted in left ear Eyes:     Extraocular Movements: Extraocular movements intact.     Pupils: Pupils are equal, round, and reactive to light.     Comments: No nystagmus  Cardiovascular:     Rate and Rhythm: Normal rate and regular rhythm.     Pulses: Normal pulses.     Heart sounds: Normal heart sounds. No murmur heard.   No gallop.  Pulmonary:     Effort: Pulmonary effort is normal. No respiratory distress.     Breath sounds: Normal breath sounds. No wheezing, rhonchi or rales.  Abdominal:     General: Bowel sounds are normal. There is no distension.     Palpations: Abdomen is soft.     Tenderness: There is no abdominal tenderness. There is no guarding or rebound.  Musculoskeletal:     Comments: 5/5 strength in both upper and lower extremities  Lymphadenopathy:     Cervical: No cervical adenopathy.  Skin:    General: Skin is warm and dry.     Comments: Rash noted beneath breasts (mild)  Neurological:     Mental Status: She is alert and oriented to person, place, and time.     Deep Tendon Reflexes:     Reflex Scores:      Patellar reflexes are 2+ on the right side and 2+ on the left side. Psychiatric:        Behavior:  Behavior normal.        Judgment: Judgment normal.    BP 118/88 (BP Location: Left Arm, Patient Position: Sitting, Cuff Size: Normal)   Pulse (!) 121   Temp 98.9 F (37.2 C) (Oral)   Resp 18   Ht 5'  5.5" (1.664 m)   Wt 189 lb 6.4 oz (85.9 kg)   SpO2 98%   BMI 31.04 kg/m  Wt Readings from Last 3 Encounters:  02/07/21 189 lb 6.4 oz (85.9 kg)  07/17/20 195 lb (88.5 kg)  02/16/19 198 lb 12.8 oz (90.2 kg) (97 %, Z= 1.94)*   * Growth percentiles are based on CDC (Girls, 2-20 Years) data.       Assessment & Plan:   Problem List Items Addressed This Visit       Unprioritized   Preventative health care    Wt Readings from Last 3 Encounters:  02/07/21 189 lb 6.4 oz (85.9 kg)  07/17/20 195 lb (88.5 kg)  02/16/19 198 lb 12.8 oz (90.2 kg) (97 %, Z= 1.94)*   * Growth percentiles are based on CDC (Girls, 2-20 Years) data.  Continue healthy diet/exercise.   Discussed diet, exercise and weight loss. She is requesting a vit D level.  Pap with chlamydia screening today. Declines gardisil.       Relevant Orders   Cytology - PAP( [facility])   Vitamin D 1,25 dihydroxy   Lipid panel   Comp Met (CMET)   Encounter for initial prescription of contraceptive pills - Primary    New. Urine Hcg, if negative, start OCP tonight.         Relevant Orders   Pregnancy, urine   Anxiety state    Remains stable on lexapro 58m once daily.        Other Visit Diagnoses     Hyperglycemia       Relevant Orders   Comp Met (CMET)   Overweight       Relevant Orders   Lipid panel        Meds ordered this encounter  Medications   Norethindrone Acetate-Ethinyl Estradiol (LOESTRIN) 1.5-30 MG-MCG tablet    Sig: Take 1 tablet by mouth daily.    Dispense:  84 tablet    Refill:  4    Order Specific Question:   Supervising Provider    Answer:   BPenni HomansA [[id number]]    I, MDebbrah AlarNP, personally preformed the services described in this documentation.  All medical record entries  made by the scribe were at my direction and in my presence.  I have reviewed the chart and discharge instructions (if applicable) and agree that the record reflects my personal performance and is accurate and complete. 02/07/2021   I,Amber Whitney,acting as a sEducation administratorfor MNance Pear NP.,have documented all relevant documentation on the behalf of MNance Pear NP,as directed by  MNance Pear NP while in the presence of MNance Pear NP.   MNance Pear NP

## 2021-02-11 LAB — CYTOLOGY - PAP
Chlamydia: NEGATIVE
Comment: NEGATIVE
Comment: NORMAL
Diagnosis: NEGATIVE
Neisseria Gonorrhea: NEGATIVE

## 2021-02-13 LAB — VITAMIN D 1,25 DIHYDROXY
Vitamin D 1, 25 (OH)2 Total: 46 pg/mL (ref 18–72)
Vitamin D2 1, 25 (OH)2: <8 pg/mL
Vitamin D3 1, 25 (OH)2: 46 pg/mL

## 2021-02-13 LAB — PREGNANCY, URINE: Preg Test, Ur: NEGATIVE

## 2021-02-17 ENCOUNTER — Other Ambulatory Visit: Payer: Self-pay | Admitting: Family

## 2021-07-30 ENCOUNTER — Ambulatory Visit: Payer: BC Managed Care – PPO | Admitting: Family

## 2021-07-30 ENCOUNTER — Telehealth: Payer: Self-pay | Admitting: Family

## 2021-07-30 DIAGNOSIS — F411 Generalized anxiety disorder: Secondary | ICD-10-CM | POA: Diagnosis not present

## 2021-07-30 MED ORDER — ESCITALOPRAM OXALATE 10 MG PO TABS: ORAL_TABLET | ORAL | 1 refills | Status: DC

## 2021-07-30 NOTE — Telephone Encounter (Signed)
See mychart.  

## 2021-07-30 NOTE — Progress Notes (Signed)
Subjective:     Patient ID: Amber Whitney, female    DOB: [birth year], 21 y.o.   MRN: [id number]  Chief Complaint  Patient presents with   Follow-up    Here for follow up    Anxiety    Patient reports doing well on Lexapro    Anxiety    Patient is in today for follow up of her anxiety.  She is maintained on lexapro.  She reports that her anxiety remains well controlled on her current dose of lexapro. She denies depression symptoms.  She is working on diet.  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   Wt Readings from Last 3 Encounters:  07/30/21 204 lb (92.5 kg)  02/07/21 189 lb 6.4 oz (85.9 kg)  07/17/20 195 lb (88.5 kg)   GAD 7 : Generalized Anxiety Score 07/30/2021 07/17/2020  Nervous, Anxious, on Edge 1 2  Control/stop worrying 1 1  Worry too much - different things 0 2  Trouble relaxing 0 1  Restless 0 2  Easily annoyed or irritable 0 0  Afraid - awful might happen 0 2  Total GAD 7 Score 2 10  Anxiety Difficulty - Somewhat difficult      Health Maintenance Due  Topic Date Due   COVID-19 Vaccine (5 - Booster for Pfizer series) 06/27/2021    Past Medical History:  Diagnosis Date   Allergy     Past Surgical History:  Procedure Laterality Date   INNER EAR SURGERY Left    tubes in both ears and reconstructive surgery of left ear.   right ankle surgery Right     Family History  Problem Relation Age of Onset   Migraines Mother    Hyperlipidemia Father    Diabetes Maternal Grandfather    Lung disease Paternal Grandmother        ? etiology   Heart disease Paternal Grandfather    Stroke Paternal Grandfather    Hypertension Paternal Grandfather    Diabetes Paternal Uncle     Social History   Socioeconomic History   Marital status: Single    Spouse name: Not on file   Number of children: Not on file   Years of education: Not on file   Highest education level: Not on file  Occupational History   Not on file  Tobacco Use    Smoking status: Never   Smokeless tobacco: Never  Vaping Use   Vaping Use: Never used  Substance and Sexual Activity   Alcohol use: No    Alcohol/week: 0.0 standard drinks   Drug use: No   Sexual activity: Not Currently    Partners: Male  Other Topics Concern   Not on file  Social History Narrative   Enjoys volleyball   Goes to [employer] group   Has one sister   Lives with parents   Rising freshman at [employer]   One dog.     Social Determinants of Health   Financial Resource Strain: Not on file  Food Insecurity: Not on file  Transportation Needs: Not on file  Physical Activity: Not on file  Stress: Not on file  Social Connections: Not on file  Intimate Partner Violence: Not on file    Outpatient Medications Prior to Visit  Medication Sig Dispense Refill   cetirizine (ZYRTEC) 10 MG tablet Take 10 mg by mouth daily.     Norethindrone Acetate-Ethinyl Estradiol (LOESTRIN) 1.5-30 MG-MCG tablet Take 1 tablet by mouth daily. 84 tablet 4  escitalopram (LEXAPRO) 10 MG tablet TAKE 1 TABLET(10 MG) BY MOUTH DAILY 90 tablet 1   No facility-administered medications prior to visit.    No Known Allergies  ROS    See HPI Objective:    Physical Exam Constitutional:      Appearance: Normal appearance.  Neurological:     Mental Status: She is alert and oriented to person, place, and time.  Psychiatric:        Mood and Affect: Mood normal.        Behavior: Behavior normal.        Thought Content: Thought content normal.        Judgment: Judgment normal.    BP 121/86 (BP Location: Left Arm, Patient Position: Sitting, Cuff Size: Small)    Pulse (!) 110    Temp 98.1 F (36.7 C) (Oral)    Resp 16    Ht 5\' 6"  (1.676 m)    Wt 204 lb (92.5 kg)    SpO2 98%    BMI 32.93 kg/m  Wt Readings from Last 3 Encounters:  07/30/21 204 lb (92.5 kg)  02/07/21 189 lb 6.4 oz (85.9 kg)  07/17/20 195 lb (88.5 kg)       Assessment & Plan:   Problem List Items Addressed This Visit        Unprioritized   Anxiety state    Stable on lexapro continue same.       Relevant Medications   escitalopram (LEXAPRO) 10 MG tablet    I am having 07/19/20 maintain her cetirizine, Norethindrone Acetate-Ethinyl Estradiol, and escitalopram.  Meds ordered this encounter  Medications   escitalopram (LEXAPRO) 10 MG tablet    Sig: TAKE 1 TABLET(10 MG) BY MOUTH DAILY    Dispense:  90 tablet    Refill:  1    Order Specific Question:   Supervising Provider    Answer:   Wyatt Mage A [[id number]]

## 2021-07-30 NOTE — Assessment & Plan Note (Signed)
Stable on lexapro- continue same.  

## 2021-10-01 DIAGNOSIS — S82851D Displaced trimalleolar fracture of right lower leg, subsequent encounter for closed fracture with routine healing: Secondary | ICD-10-CM | POA: Diagnosis not present

## 2021-12-30 ENCOUNTER — Encounter: Payer: Self-pay | Admitting: Family

## 2022-01-07 ENCOUNTER — Ambulatory Visit (INDEPENDENT_AMBULATORY_CARE_PROVIDER_SITE_OTHER): Payer: BC Managed Care – PPO | Admitting: Family

## 2022-01-07 ENCOUNTER — Encounter: Payer: Self-pay | Admitting: Family

## 2022-01-07 VITALS — BP 128/81 | HR 124 | Temp 98.4°F | Resp 16 | Ht 66.0 in | Wt 209.0 lb

## 2022-01-07 DIAGNOSIS — Z23 Encounter for immunization: Secondary | ICD-10-CM | POA: Diagnosis not present

## 2022-01-07 DIAGNOSIS — Z0000 Encounter for general adult medical examination without abnormal findings: Secondary | ICD-10-CM

## 2022-01-07 DIAGNOSIS — F411 Generalized anxiety disorder: Secondary | ICD-10-CM

## 2022-01-07 MED ORDER — FLUOXETINE HCL 20 MG PO CAPS: 20.0000 mg | ORAL_CAPSULE | Freq: Every day | ORAL | 0 refills | Status: DC

## 2022-01-07 NOTE — Progress Notes (Signed)
Subjective:     Patient ID: Amber Whitney, female    DOB: 10/29/2000, 21 y.o.   MRN: [id number]  Chief Complaint  Patient presents with   Annual Exam         HPI Patient is in today for cpx.  Wt Readings from Last 3 Encounters:  01/07/22 209 lb (94.8 kg)  07/30/21 204 lb (92.5 kg)  02/07/21 189 lb 6.4 oz (85.9 kg)  Immunizations: Tdap due, covid vaccines up to date Diet:  overall healthy Exercise: was working out more at school Pap Smear: 02/07/21 Vision: up to date Dental: up to date  Anxiety- overall OK on lexapro 10mg .   On loestrin for OCP.    Health Maintenance Due  Topic Date Due   COVID-19 Vaccine (5 - Pfizer series) 06/27/2021    Past Medical History:  Diagnosis Date   Allergy     Past Surgical History:  Procedure Laterality Date   INNER EAR SURGERY Left    tubes in both ears and reconstructive surgery of left ear.   right ankle surgery Right 09/2020    Family History  Problem Relation Age of Onset   Migraines Mother    Hyperlipidemia Father    Diabetes Maternal Grandfather    Lung disease Paternal Grandmother        ? etiology   Heart disease Paternal Grandfather    Stroke Paternal Grandfather    Hypertension Paternal Grandfather    Diabetes Paternal Uncle     Social History   Socioeconomic History   Marital status: Single    Spouse name: Not on file   Number of children: Not on file   Years of education: Not on file   Highest education level: Not on file  Occupational History   Not on file  Tobacco Use   Smoking status: Never   Smokeless tobacco: Never  Vaping Use   Vaping Use: Never used  Substance and Sexual Activity   Alcohol use: No    Alcohol/week: 0.0 standard drinks of alcohol   Drug use: No   Sexual activity: Not Currently    Partners: Male  Other Topics Concern   Not on file  Social History Narrative   Enjoys volleyball   Goes to [employer] group   Has one sister   Lives with parents   College at [employer], Sociology major   One [employer].     Social Determinants of Health   Financial Resource Strain: Not on file  Food Insecurity: Not on file  Transportation Needs: Not on file  Physical Activity: Not on file  Stress: Not on file  Social Connections: Not on file  Intimate Partner Violence: Not on file    Outpatient Medications Prior to Visit  Medication Sig Dispense Refill   cetirizine (ZYRTEC) 10 MG tablet Take 10 mg by mouth daily.     Norethindrone Acetate-Ethinyl Estradiol (LOESTRIN) 1.5-30 MG-MCG tablet Take 1 tablet by mouth daily. 84 tablet 4   escitalopram (LEXAPRO) 10 MG tablet TAKE 1 TABLET(10 MG) BY MOUTH DAILY 90 tablet 1   No facility-administered medications prior to visit.    No Known Allergies  Review of Systems  Constitutional:  Negative for weight loss.  HENT:  Negative for congestion and hearing loss.   Eyes:  Negative for blurred vision.  Respiratory:  Negative for cough.   Cardiovascular:  Negative for leg swelling.  Gastrointestinal:  Negative for constipation and diarrhea.  Genitourinary:  Negative for dysuria and frequency.  Musculoskeletal:  Negative for joint pain and myalgias.  Skin:  Negative for rash.  Neurological:  Negative for headaches.  Psychiatric/Behavioral:         Denies depression       Objective:    Physical Exam  BP 128/81 (BP Location: Left Arm, Patient Position: Sitting, Cuff Size: Large)   Pulse (!) 124   Temp 98.4 F (36.9 C) (Oral)   Resp 16   Ht 5\' 6"  (1.676 m)   Wt 209 lb (94.8 kg)   SpO2 98%   BMI 33.73 kg/m  Wt Readings from Last 3 Encounters:  01/07/22 209 lb (94.8 kg)  07/30/21 204 lb (92.5 kg)  02/07/21 189 lb 6.4 oz (85.9 kg)   Physical Exam  Constitutional: She is oriented to person, place, and time. She appears well-developed and well-nourished. No distress.  HENT:  Head: Normocephalic and atraumatic.  Right Ear: Tympanic membrane and ear canal normal.  Left Ear: Tympanic membrane and ear canal  normal.  Mouth/Throat: Oropharynx is clear and moist.  Eyes: Pupils are equal, round, and reactive to light. No scleral icterus.  Neck: Normal range of motion. No thyromegaly present.  Cardiovascular: mild tachycardia (admits to feeling anxious about possible blood draw).  Repeat HR check was 100 bpm No murmur heard. Pulmonary/Chest: Effort normal and breath sounds normal. No respiratory distress. He has no wheezes. She has no rales. She exhibits no tenderness.  Abdominal: Soft. Bowel sounds are normal. She exhibits no distension and no mass. There is no tenderness. There is no rebound and no guarding.  Musculoskeletal: She exhibits no edema.  Lymphadenopathy:    She has no cervical adenopathy.  Neurological: She is alert and oriented to person, place, and time. She has normal patellar reflexes. She exhibits normal muscle tone. Coordination normal.  Skin: Skin is warm and dry.  Psychiatric: She has a normal mood and affect. Her behavior is normal. Judgment and thought content normal.  Breast/pelvic: deferred           Assessment & Plan:       Assessment & Plan:   Problem List Items Addressed This Visit       Unprioritized   Preventative health care - Primary    Tdap today.  Pap up to date.  Discussed diet/exercise/weight loss.       Anxiety state    Fair control on lexapro, but I am concerned about her 21 pound weight gain this year. Discussed importance of regular exercise and healthy diet/weight loss.  Will try transitioning her from lexapro to prozac which is less likely to be associated with weight gain.       Relevant Medications   FLUoxetine (PROZAC) 20 MG capsule    I have discontinued Debe Loflin's escitalopram. I am also having her start on FLUoxetine. Additionally, I am having her maintain her cetirizine and Norethindrone Acetate-Ethinyl Estradiol.  Meds ordered this encounter  Medications   FLUoxetine (PROZAC) 20 MG capsule    Sig: Take 1 capsule (20 mg  total) by mouth daily.    Dispense:  90 capsule    Refill:  0    Order Specific Question:   Supervising Provider    Answer:   02/09/21 A [[id number]]

## 2022-01-07 NOTE — Patient Instructions (Signed)
Stop Lexapro, start prozac. Work hard on diet, exercise and weight loss.

## 2022-01-07 NOTE — Assessment & Plan Note (Signed)
Tdap today.  Pap up to date.  Discussed diet/exercise/weight loss.

## 2022-01-07 NOTE — Assessment & Plan Note (Signed)
Fair control on lexapro, but I am concerned about her 21 pound weight gain this year. Discussed importance of regular exercise and healthy diet/weight loss.  Will try transitioning her from lexapro to prozac which is less likely to be associated with weight gain.

## 2022-01-07 NOTE — Addendum Note (Signed)
Addended by: Wilford Corner on: 01/07/2022 04:38 PM   Modules accepted: Orders

## 2022-01-13 ENCOUNTER — Encounter: Payer: Self-pay | Admitting: Family

## 2022-01-13 MED ORDER — ESCITALOPRAM OXALATE 10 MG PO TABS: 10.0000 mg | ORAL_TABLET | Freq: Every day | ORAL | 0 refills | Status: DC

## 2022-02-11 ENCOUNTER — Other Ambulatory Visit: Payer: Self-pay | Admitting: Family

## 2022-02-17 ENCOUNTER — Telehealth: Payer: Self-pay | Admitting: Family

## 2022-02-17 NOTE — Telephone Encounter (Signed)
See mychart.  

## 2022-02-25 ENCOUNTER — Other Ambulatory Visit: Payer: Self-pay

## 2022-02-25 MED ORDER — NORETHINDRONE ACET-ETHINYL EST 1.5-30 MG-MCG PO TABS: 1.0000 | ORAL_TABLET | Freq: Every day | ORAL | 4 refills | Status: DC

## 2022-02-27 ENCOUNTER — Telehealth: Payer: Self-pay | Admitting: Family

## 2022-02-27 ENCOUNTER — Ambulatory Visit: Payer: BC Managed Care – PPO | Admitting: Family

## 2022-02-27 MED ORDER — NORETHINDRONE ACET-ETHINYL EST 1.5-30 MG-MCG PO TABS: 1.0000 | ORAL_TABLET | Freq: Every day | ORAL | 4 refills | Status: DC

## 2022-02-27 NOTE — Telephone Encounter (Signed)
Pt called stating that the pharmacy had not received her Rx for her birth Loestrin that we sent over on 8.2.23. Receipt confirmation was there however, advised pt we would look into this. Pt stated she will run out on 8.6.23 if she doesn't have it refilled.

## 2022-02-27 NOTE — Telephone Encounter (Signed)
Rx sent 

## 2022-02-27 NOTE — Telephone Encounter (Signed)
LMOM asking which pharmacy she would like to use. I noticed that [facility] in [place] and the [facility] in [place], [place] are in her preferred pharmacies.

## 2022-02-27 NOTE — Telephone Encounter (Signed)
Patient would like to use the [facility] on [address].

## 2022-05-21 ENCOUNTER — Telehealth: Payer: Self-pay | Admitting: Family

## 2022-05-21 NOTE — Telephone Encounter (Signed)
Medication:   escitalopram (LEXAPRO) 10 MG tablet [[id number]]   Has the patient contacted their pharmacy? No. (If no, request that the patient contact the pharmacy for the refill.) (If yes, when and what did the pharmacy advise?)  Preferred Pharmacy (with phone number or street name):   [facility] [address] [place], [place] [address] [phone]  Agent: Please be advised that RX refills may take up to 3 business days. We ask that you follow-up with your pharmacy.

## 2022-05-22 MED ORDER — ESCITALOPRAM OXALATE 10 MG PO TABS: ORAL_TABLET | ORAL | 0 refills | Status: DC

## 2022-05-22 NOTE — Telephone Encounter (Signed)
Please advise if ov needed

## 2022-05-22 NOTE — Addendum Note (Signed)
Addended by: Debbrah Alar on: 05/22/2022 04:07 PM   Modules accepted: Orders

## 2022-05-22 NOTE — Telephone Encounter (Signed)
Please contact pt to schedule OV, 30 day supply sent.

## 2022-05-25 NOTE — Telephone Encounter (Signed)
Called but no answer, left voice mail for patient to call and set up follow up appointment before she is due for next ov

## 2022-06-29 ENCOUNTER — Encounter: Payer: Self-pay | Admitting: Family

## 2022-06-29 MED ORDER — ESCITALOPRAM OXALATE 10 MG PO TABS: ORAL_TABLET | ORAL | 0 refills | Status: DC

## 2022-07-14 ENCOUNTER — Encounter: Payer: Self-pay | Admitting: Family

## 2022-07-14 MED ORDER — NORETHINDRONE ACET-ETHINYL EST 1.5-30 MG-MCG PO TABS: 1.0000 | ORAL_TABLET | Freq: Every day | ORAL | 0 refills | Status: DC

## 2022-07-28 ENCOUNTER — Other Ambulatory Visit: Payer: Self-pay | Admitting: Family

## 2022-08-28 ENCOUNTER — Telehealth: Payer: BC Managed Care – PPO | Admitting: Family

## 2022-08-28 DIAGNOSIS — F411 Generalized anxiety disorder: Secondary | ICD-10-CM | POA: Diagnosis not present

## 2022-08-28 DIAGNOSIS — Z3040 Encounter for surveillance of contraceptives, unspecified: Secondary | ICD-10-CM | POA: Diagnosis not present

## 2022-08-28 MED ORDER — ESCITALOPRAM OXALATE 10 MG PO TABS: ORAL_TABLET | ORAL | 1 refills | Status: DC

## 2022-08-28 MED ORDER — NORETHINDRONE ACET-ETHINYL EST 1.5-30 MG-MCG PO TABS: 1.0000 | ORAL_TABLET | Freq: Every day | ORAL | 4 refills | Status: DC

## 2022-08-28 NOTE — Progress Notes (Signed)
MyChart Video Visit    Virtual Visit via Video Note   This visit type was conducted due to national recommendations for restrictions regarding the COVID-19 Pandemic (e.g. social distancing) in an effort to limit this patient's exposure and mitigate transmission in our community. This patient is at least at moderate risk for complications without adequate follow up. This format is felt to be most appropriate for this patient at this time. Physical exam was limited by quality of the video and audio technology used for the visit. CMA was able to get the patient set up on a video visit.  Patient location: Home. Patient and provider in visit Provider location: Office  I discussed the limitations of evaluation and management by telemedicine and the availability of in person appointments. The patient expressed understanding and agreed to proceed.  Visit Date: 08/28/2022  Today's healthcare provider: Nance Pear, NP     Subjective:    Patient ID: Amber Whitney, female    DOB: [birth year], 22 y.o.   MRN: [id number]  Chief Complaint  Patient presents with   Anxiety    Follow up, needs lexapro refill   Contraception    Needs birth control refill      Patient presents for follow up. Attempted to connect via video but pt's camera was not functioning properly and we switched to an audio only visit.   Anxiety- she opted not to switch from lexapro to prozac as she was doing well on lexapro. States she has been exercising more and believes that her weight the last time she weighed was 195.  Wt Readings from Last 3 Encounters:  01/07/22 209 lb (94.8 kg)  07/30/21 204 lb (92.5 kg)  02/07/21 189 lb 6.4 oz (85.9 kg)   She has not had her covid or flu shots this year and is encouraged to get them at her school.  Contraception- doing well on current OCP.  Periods are regular.     Past Medical History:  Diagnosis Date   Allergy     Past Surgical History:  Procedure  Laterality Date   INNER EAR SURGERY Left    tubes in both ears and reconstructive surgery of left ear.   right ankle surgery Right 09/2020    Family History  Problem Relation Age of Onset   Migraines Mother    Hyperlipidemia Father    Diabetes Maternal Grandfather    Lung disease Paternal Grandmother        ? etiology   Heart disease Paternal Grandfather    Stroke Paternal Grandfather    Hypertension Paternal Grandfather    Diabetes Paternal Uncle     Social History   Socioeconomic History   Marital status: Significant Other    Spouse name: Not on file   Number of children: Not on file   Years of education: Not on file   Highest education level: Not on file  Occupational History   Not on file  Tobacco Use   Smoking status: Never   Smokeless tobacco: Never  Vaping Use   Vaping Use: Never used  Substance and Sexual Activity   Alcohol use: No    Alcohol/week: 0.0 standard drinks of alcohol   Drug use: No   Sexual activity: Not Currently    Partners: Male  Other Topics Concern   Not on file  Social History Narrative   Enjoys volleyball   Goes to [employer] group   Has one sister   Lives with parents   College at  [employer], Sociology major   One dog.     Social Determinants of Health   Financial Resource Strain: Not on file  Food Insecurity: Not on file  Transportation Needs: Not on file  Physical Activity: Not on file  Stress: Not on file  Social Connections: Not on file  Intimate Partner Violence: Not on file    Outpatient Medications Prior to Visit  Medication Sig Dispense Refill   cetirizine (ZYRTEC) 10 MG tablet Take 10 mg by mouth daily.     escitalopram (LEXAPRO) 10 MG tablet TAKE 1 TABLET(10 MG) BY MOUTH DAILY 30 tablet 0   Norethindrone Acetate-Ethinyl Estradiol (LOESTRIN) 1.5-30 MG-MCG tablet Take 1 tablet by mouth daily. 28 tablet 0   No facility-administered medications prior to visit.    No Known Allergies  ROS See HPI     Objective:    Physical Exam Constitutional:      General: She is not in acute distress.    Appearance: She is well-developed.  Neck:     Thyroid: No thyromegaly.  Cardiovascular:     Heart sounds: Normal heart sounds.  Pulmonary:     Comments: Able to talk in full sentences Skin:    General: Skin is warm and dry.  Neurological:     Mental Status: She is alert and oriented to person, place, and time.  Psychiatric:        Mood and Affect: Mood normal.        Behavior: Behavior normal.        Thought Content: Thought content normal.        Judgment: Judgment normal.     There were no vitals taken for this visit. Wt Readings from Last 3 Encounters:  01/07/22 209 lb (94.8 kg)  07/30/21 204 lb (92.5 kg)  02/07/21 189 lb 6.4 oz (85.9 kg)       Assessment & Plan:   Problem List Items Addressed This Visit       Unprioritized   Contraception management - Primary    Continue current OCP, tolerating without side effects.       Anxiety state    Stable on current dose of lexapro. Continue same. She will send me her updated weight via mychart. Continue regular exercise for weight management.        I am having Amber Whitney maintain her cetirizine, Norethindrone Acetate-Ethinyl Estradiol, and escitalopram.  No orders of the defined types were placed in this encounter.   I discussed the assessment and treatment plan with the patient. The patient was provided an opportunity to ask questions and all were answered. The patient agreed with the plan and demonstrated an understanding of the instructions.   The patient was advised to call back or seek an in-person evaluation if the symptoms worsen or if the condition fails to improve as anticipated.  I provided 8 minutes of face-to-face time during this encounter.   Nance Pear, NP [facility] (phone) [phone] (fax)  [place]

## 2022-08-28 NOTE — Assessment & Plan Note (Signed)
Continue current OCP, tolerating without side effects.

## 2022-08-28 NOTE — Assessment & Plan Note (Signed)
Stable on current dose of lexapro. Continue same. She will send me her updated weight via mychart. Continue regular exercise for weight management.

## 2023-02-19 ENCOUNTER — Other Ambulatory Visit: Payer: Self-pay

## 2023-02-19 MED ORDER — ESCITALOPRAM OXALATE 10 MG PO TABS: 10.0000 mg | ORAL_TABLET | Freq: Every day | ORAL | 0 refills | Status: DC

## 2023-03-20 ENCOUNTER — Other Ambulatory Visit: Payer: Self-pay | Admitting: Family

## 2023-04-26 DIAGNOSIS — T8484XA Pain due to internal orthopedic prosthetic devices, implants and grafts, initial encounter: Secondary | ICD-10-CM | POA: Diagnosis not present

## 2023-04-26 DIAGNOSIS — M25571 Pain in right ankle and joints of right foot: Secondary | ICD-10-CM | POA: Diagnosis not present

## 2023-06-21 ENCOUNTER — Other Ambulatory Visit: Payer: Self-pay | Admitting: Family

## 2023-07-19 ENCOUNTER — Other Ambulatory Visit: Payer: Self-pay | Admitting: Family

## 2023-08-01 ENCOUNTER — Encounter: Payer: Self-pay | Admitting: Family

## 2023-08-02 ENCOUNTER — Encounter: Payer: BC Managed Care – PPO | Admitting: Family

## 2023-08-02 MED ORDER — ESCITALOPRAM OXALATE 10 MG PO TABS: 10.0000 mg | ORAL_TABLET | Freq: Every day | ORAL | 0 refills | Status: DC

## 2023-08-10 ENCOUNTER — Encounter: Payer: BC Managed Care – PPO | Admitting: Family

## 2023-08-18 ENCOUNTER — Encounter: Payer: BC Managed Care – PPO | Admitting: Family

## 2023-08-27 ENCOUNTER — Encounter: Payer: Self-pay | Admitting: Family

## 2023-08-27 ENCOUNTER — Ambulatory Visit (INDEPENDENT_AMBULATORY_CARE_PROVIDER_SITE_OTHER): Payer: BC Managed Care – PPO | Admitting: Family

## 2023-08-27 VITALS — BP 128/72 | HR 107 | Temp 98.6°F | Resp 16 | Ht 66.0 in | Wt 247.0 lb

## 2023-08-27 DIAGNOSIS — R7989 Other specified abnormal findings of blood chemistry: Secondary | ICD-10-CM | POA: Diagnosis not present

## 2023-08-27 DIAGNOSIS — R635 Abnormal weight gain: Secondary | ICD-10-CM | POA: Diagnosis not present

## 2023-08-27 DIAGNOSIS — Z6839 Body mass index (BMI) 39.0-39.9, adult: Secondary | ICD-10-CM | POA: Diagnosis not present

## 2023-08-27 DIAGNOSIS — F411 Generalized anxiety disorder: Secondary | ICD-10-CM

## 2023-08-27 DIAGNOSIS — Z0000 Encounter for general adult medical examination without abnormal findings: Secondary | ICD-10-CM | POA: Diagnosis not present

## 2023-08-27 DIAGNOSIS — E66812 Obesity, class 2: Secondary | ICD-10-CM

## 2023-08-27 MED ORDER — ESCITALOPRAM OXALATE 10 MG PO TABS: 10.0000 mg | ORAL_TABLET | Freq: Every day | ORAL | 1 refills | Status: DC

## 2023-08-27 NOTE — Patient Instructions (Addendum)
VISIT SUMMARY:  You came in today for your annual physical exam. We discussed your current health status, including your weight management, ear health, depression management, birth control, and general health maintenance. You reported no major health concerns and have been actively working on improving your diet and exercise routine.  YOUR PLAN:  -WEIGHT MANAGEMENT: Weight management involves maintaining a healthy weight through diet and exercise. You have been increasing your physical activity and improving your diet. We discussed the potential weight gain side effect of Lexapro. Continue with your exercise and healthy eating. We will check your metabolic panel, cholesterol, and thyroid function to see if there are any other factors contributing to weight gain. If covered by insurance, we may consider weight loss injections like Zepbound or Wegovy.  -EAR WAX BUILDUP: Ear wax buildup can occur more easily in ears that have had surgeries. You have a history of ear surgeries, and we recommend using earwax drops and a gentle bulb syringe to manage this at home.  -DEPRESSION: Depression is a mood disorder that can be managed with medication. You are currently taking Lexapro 10mg  and have no serious concerns. Continue with your current dosage, and we will monitor for any potential weight gain side effects. Your prescription has been refilled for 90 days with a refill.  -BIRTH CONTROL: Birth control pills are used to prevent pregnancy. You have a sufficient supply and do not need a refill at this time.  -PAP SMEAR: A Pap smear is a test to screen for cervical cancer. You are due for your next Pap smear in July, so we will schedule a follow-up in 6 months.  -COVID-19 VACCINATION: COVID-19 vaccination helps protect against the coronavirus. You have received boosters but not recently. We encourage you to get an updated COVID-19 booster shot.  -GENERAL HEALTH MAINTENANCE: General health maintenance includes  routine check-ups and lifestyle modifications to maintain overall health. Continue with your current lifestyle changes for weight management. We will check your labs, including metabolic panel, cholesterol, and thyroid function. If covered by insurance, we may consider weight loss injections. Schedule a follow-up in 6 months for a Pap smear and general check-up.  INSTRUCTIONS:  Please schedule a follow-up appointment in 6 months for your Pap smear and general check-up. Additionally, get an updated COVID-19 booster shot at your pharmacy.

## 2023-08-27 NOTE — Assessment & Plan Note (Signed)
-  Continue current lifestyle modifications for weight management. -Check labs including metabolic panel, cholesterol, and thyroid function. -Consider weight loss injections if covered by insurance. -Schedule follow-up in 6 months for Pap smear and general check-up.

## 2023-08-27 NOTE — Assessment & Plan Note (Signed)
  Managed with Lexapro 10mg . No current concerns reported. -Continue Lexapro 10mg . Monitor for potential weight gain side effect. -Refill Lexapro 10mg  prescription (90 days with refill).

## 2023-08-27 NOTE — Assessment & Plan Note (Signed)
  Patient reports increased physical activity at work and gym attendance. Acknowledges need for dietary improvements. Noted potential weight gain side effect of Lexapro.  -Encouraged continued exercise and healthy diet.  -Consider weight loss injections (Zepbound or Wegovy) if covered by insurance.  -Check metabolic panel, cholesterol, and thyroid function to assess for potential contributors to weight gain.

## 2023-08-27 NOTE — Progress Notes (Signed)
Subjective:     Patient ID: Amber Whitney, female    DOB: Nov 01, 2000, 23 y.o.   MRN: [id number]  Chief Complaint  Patient presents with   Annual Exam         HPI  Discussed the use of AI scribe software for clinical note transcription with the patient, who gave verbal consent to proceed.  History of Present Illness        The patient presents for an annual physical exam. No current cough, cold symptoms, skin rash, swelling in the legs, hearing or vision issues with glasses, digestive problems, unusual muscle or joint pain, frequent headaches, or serious concerns about depression or anxiety. Periods are regular with the use of birth control pills. Her last recorded weight was 209 pounds. She has increased physical activity at work and has been going to the gym more frequently. She is mindful of her eating habits and mentions that her diet has improved since leaving school, where options were limited. She has a history of ear surgeries, including patching of the eardrum and tube placements. The repaired ear tends to accumulate wax more easily. She has not used earwax drops. She is currently taking Lexapro 10 mg and has a supply of birth control pills. No serious concerns about depression or anxiety. Discussed potential weight gain as a side effect of Lexapro, but benefits currently outweigh the negatives. 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 She does not consume alcohol, use drugs, tobacco, or vape.   Immunizations: reports flu shot this past fall Diet: She is working on her diet more now.   Wt Readings from Last 3 Encounters:  08/27/23 247 lb (112 kg)  01/07/22 209 lb (94.8 kg)  07/30/21 204 lb (92.5 kg)  Exercise: very active at work, exercise activities at work, goes to the gym  Pap Smear: Pap is due in July Vision: up to date Dental: up to date      Health Maintenance Due  Topic Date Due   HPV VACCINES (1 - 3-dose series) Never done    COVID-19 Vaccine (5 - 2024-25 season) 03/28/2023    Past Medical History:  Diagnosis Date   Allergy     Past Surgical History:  Procedure Laterality Date   INNER EAR SURGERY Left 2011   tubes in both ears and reconstructive surgery of left ear due to hole in TM from tympanostomy tubes   right ankle surgery Right 09/2020   TYMPANOSTOMY TUBE PLACEMENT Bilateral     Family History  Problem Relation Age of Onset   Migraines Mother    Hyperlipidemia Father    Diabetes Maternal Grandfather    Lung disease Paternal Grandmother        ? etiology   Heart disease Paternal Grandfather    Stroke Paternal Grandfather    Hypertension Paternal Grandfather    Diabetes Paternal Uncle     Social History   Socioeconomic History   Marital status: Significant Other    Spouse name: Not on file   Number of children: Not on file   Years of education: Not on file   Highest education level: Bachelor's degree (e.g., BA, AB, BS)  Occupational History   Not on file  Tobacco Use   Smoking status: Never   Smokeless tobacco: Never  Vaping Use   Vaping status: Never Used  Substance and Sexual Activity   Alcohol use: No    Alcohol/week: 0.0 standard drinks of alcohol   Drug  use: No   Sexual activity: Not Currently    Partners: Male  Other Topics Concern   Not on file  Social History Narrative   Enjoys volleyball   Goes to [employer] group   Has one sister   Lives with parents   College at [employer], Sociology major   One Nurse, mental health.     Social Drivers of Corporate investment banker Strain: Low Risk  (08/18/2023)   Overall Financial Resource Strain (CARDIA)    Difficulty of Paying Living Expenses: Not hard at all  Food Insecurity: No Food Insecurity (08/18/2023)   Hunger Vital Sign    Worried About Running Out of Food in the Last Year: Never true    Ran Out of Food in the Last Year: Never true  Transportation Needs: No Transportation Needs (08/18/2023)   PRAPARE - Therapist, art (Medical): No    Lack of Transportation (Non-Medical): No  Physical Activity: Sufficiently Active (08/18/2023)   Exercise Vital Sign    Days of Exercise per Week: 5 days    Minutes of Exercise per Session: 30 min  Stress: No Stress Concern Present (08/18/2023)   [employer] of Occupational Health - Occupational Stress Questionnaire    Feeling of Stress : Only a little  Social Connections: Unknown (08/18/2023)   Social Connection and Isolation Panel [NHANES]    Frequency of Communication with Friends and Family: More than three times a week    Frequency of Social Gatherings with Friends and Family: Once a week    Attends Religious Services: 1 to 4 times per year    Active Member of [employer] or Organizations: No    Attends Engineer, structural: Not on file    Marital Status: Patient declined  Intimate Partner Violence: Not on file    Outpatient Medications Prior to Visit  Medication Sig Dispense Refill   cetirizine (ZYRTEC) 10 MG tablet Take 10 mg by mouth daily.     Norethindrone Acetate-Ethinyl Estradiol (LOESTRIN) 1.5-30 MG-MCG tablet Take 1 tablet by mouth daily. 84 tablet 4   escitalopram (LEXAPRO) 10 MG tablet Take 1 tablet (10 mg total) by mouth daily. 30 tablet 0   No facility-administered medications prior to visit.    No Known Allergies  Review of Systems  Constitutional:  Negative for weight loss.  Eyes:  Negative for blurred vision.  Respiratory:  Negative for cough.   Cardiovascular:  Negative for leg swelling.  Gastrointestinal:  Negative for constipation and diarrhea.  Genitourinary:  Negative for dysuria.  Musculoskeletal:  Negative for joint pain and myalgias.  Skin:  Negative for rash.  Neurological:  Negative for headaches.  Psychiatric/Behavioral:         Denies depression/anxiety       Objective:    Physical Exam   BP 128/72 (BP Location: Left Arm, Patient Position: Sitting, Cuff Size: Large)   Pulse (!) 107    Temp 98.6 F (37 C) (Oral)   Resp 16   Ht 5\' 6"  (1.676 m)   Wt 247 lb (112 kg)   SpO2 99%   BMI 39.87 kg/m  Wt Readings from Last 3 Encounters:  08/27/23 247 lb (112 kg)  01/07/22 209 lb (94.8 kg)  07/30/21 204 lb (92.5 kg)   Physical Exam  Constitutional: She is oriented to person, place, and time. She appears well-developed and well-nourished. No distress.  HENT:  Head: Normocephalic and atraumatic.  Right Ear: Tympanic membrane and ear canal normal.  Left Ear: cerumen impaction Mouth/Throat: Oropharynx is clear and moist.  Eyes: Pupils are equal, round, and reactive to light. No scleral icterus.  Neck: Normal range of motion. No thyromegaly present.  Cardiovascular: Normal rate and regular rhythm.   No murmur heard. Pulmonary/Chest: Effort normal and breath sounds normal. No respiratory distress. He has no wheezes. She has no rales. She exhibits no tenderness.  Abdominal: Soft. Bowel sounds are normal. She exhibits no distension and no mass. There is no tenderness. There is no rebound and no guarding.  Musculoskeletal: She exhibits no edema.  Lymphadenopathy:    She has no cervical adenopathy.  Neurological: She is alert and oriented to person, place, and time. She has normal patellar reflexes. She exhibits normal muscle tone. Coordination normal.  Skin: Skin is warm and dry.  Psychiatric: She has a normal mood and affect. Her behavior is normal. Judgment and thought content normal.  Breast/pelvic: deferred         Assessment & Plan:       Assessment & Plan:   Problem List Items Addressed This Visit       Unprioritized   Preventative health care - Primary    -Continue current lifestyle modifications for weight management. -Check labs including metabolic panel, cholesterol, and thyroid function. -Consider weight loss injections if covered by insurance. -Schedule follow-up in 6 months for Pap smear and general check-up.       Relevant Orders   Lipid panel    Class 2 obesity without serious comorbidity with body mass index (BMI) of 39.0 to 39.9 in adult    Patient reports increased physical activity at work and gym attendance. Acknowledges need for dietary improvements. Noted potential weight gain side effect of Lexapro.  -Encouraged continued exercise and healthy diet.  -Consider weight loss injections (Zepbound or Wegovy) if covered by insurance.  -Check metabolic panel, cholesterol, and thyroid function to assess for potential contributors to weight gain.       Anxiety state    Managed with Lexapro 10mg . No current concerns reported. -Continue Lexapro 10mg . Monitor for potential weight gain side effect. -Refill Lexapro 10mg  prescription (90 days with refill).       Relevant Medications   escitalopram (LEXAPRO) 10 MG tablet   Other Visit Diagnoses       Abnormal LFTs       Relevant Orders   Comp Met (CMET)     Weight gain       Relevant Orders   TSH       I am having Wyatt Mage maintain her cetirizine, Norethindrone Acetate-Ethinyl Estradiol, and escitalopram.  Meds ordered this encounter  Medications   escitalopram (LEXAPRO) 10 MG tablet    Sig: Take 1 tablet (10 mg total) by mouth daily.    Dispense:  90 tablet    Refill:  1    Requested drug refills are authorized, however, the patient needs further evaluation and/or laboratory testing before further refills are given. Ask her to make an appointment for this.    Supervising Provider:   Danise Edge A [phone]

## 2023-08-28 LAB — COMPREHENSIVE METABOLIC PANEL
AG Ratio: 1.6 (calc) (ref 1.0–2.5)
ALT: 21 U/L (ref 6–29)
AST: 12 U/L (ref 10–30)
Albumin: 4.6 g/dL (ref 3.6–5.1)
Alkaline phosphatase (APISO): 60 U/L (ref 31–125)
BUN: 15 mg/dL (ref 7–25)
CO2: 19 mmol/L — ABNORMAL LOW (ref 20–32)
Calcium: 10 mg/dL (ref 8.6–10.2)
Chloride: 107 mmol/L (ref 98–110)
Creat: 0.8 mg/dL (ref 0.50–0.96)
Globulin: 2.9 g/dL (ref 1.9–3.7)
Glucose, Bld: 80 mg/dL (ref 65–99)
Potassium: 4.1 mmol/L (ref 3.5–5.3)
Sodium: 139 mmol/L (ref 135–146)
Total Bilirubin: 0.3 mg/dL (ref 0.2–1.2)
Total Protein: 7.5 g/dL (ref 6.1–8.1)

## 2023-08-28 LAB — LIPID PANEL
Cholesterol: 172 mg/dL (ref <200)
HDL: 52 mg/dL (ref >=50)
LDL Cholesterol (Calc): 103 mg/dL — ABNORMAL HIGH
Non-HDL Cholesterol (Calc): 120 mg/dL (ref <130)
Total CHOL/HDL Ratio: 3.3 (calc) (ref <5.0)
Triglycerides: 78 mg/dL (ref <150)

## 2023-08-28 LAB — TSH: TSH: 3.77 m[IU]/L

## 2023-08-29 ENCOUNTER — Encounter: Payer: Self-pay | Admitting: Family

## 2023-11-17 ENCOUNTER — Other Ambulatory Visit: Payer: Self-pay | Admitting: Family

## 2024-02-28 ENCOUNTER — Encounter: Payer: Self-pay | Admitting: Family

## 2024-02-28 DIAGNOSIS — F411 Generalized anxiety disorder: Secondary | ICD-10-CM

## 2024-02-28 MED ORDER — ESCITALOPRAM OXALATE 10 MG PO TABS: 10.0000 mg | ORAL_TABLET | Freq: Every day | ORAL | 0 refills | Status: DC

## 2024-02-28 NOTE — Addendum Note (Signed)
 Addended by: DARYL SETTER on: 02/28/2024 02:06 PM   Modules accepted: Orders

## 2024-05-25 ENCOUNTER — Other Ambulatory Visit: Payer: Self-pay | Admitting: Family

## 2024-05-25 DIAGNOSIS — F411 Generalized anxiety disorder: Secondary | ICD-10-CM

## 2024-05-30 ENCOUNTER — Ambulatory Visit: Admitting: Family

## 2024-05-30 ENCOUNTER — Other Ambulatory Visit (HOSPITAL_COMMUNITY)
Admission: RE | Admit: 2024-05-30 | Discharge: 2024-05-30 | Disposition: A | Discharge status: Home / Self Care | Source: Ambulatory Visit | Attending: Family | Admitting: Family

## 2024-05-30 VITALS — BP 129/85 | HR 109 | Temp 98.6°F | Resp 16 | Ht 66.0 in | Wt 270.0 lb

## 2024-05-30 DIAGNOSIS — L304 Erythema intertrigo: Secondary | ICD-10-CM

## 2024-05-30 DIAGNOSIS — Z01419 Encounter for gynecological examination (general) (routine) without abnormal findings: Secondary | ICD-10-CM | POA: Insufficient documentation

## 2024-05-30 DIAGNOSIS — F411 Generalized anxiety disorder: Secondary | ICD-10-CM | POA: Diagnosis not present

## 2024-05-30 NOTE — Progress Notes (Unsigned)
 Subjective:     Patient ID: Amber Whitney, female    DOB: [birth year], 23 y.o.   MRN: [id number]  Chief Complaint  Patient presents with  . Follow-up    Follow up for pap smear  . Anxiety    Here for follow up    HPI  Discussed the use of AI scribe software for clinical note transcription with the patient, who gave verbal consent to proceed.  History of Present Illness   7    Health Maintenance Due  Topic Date Due  . HPV VACCINES (1 - 3-dose series) Never done  . Cervical Cancer Screening (Pap smear)  02/08/2024  . COVID-19 Vaccine (5 - 2025-26 season) 03/27/2024    Past Medical History:  Diagnosis Date  . Allergy     Past Surgical History:  Procedure Laterality Date  . INNER EAR SURGERY Left 2011   tubes in both ears and reconstructive surgery of left ear due to hole in TM from tympanostomy tubes  . right ankle surgery Right 09/2020  . TYMPANOSTOMY TUBE PLACEMENT Bilateral     Family History  Problem Relation Age of Onset  . Migraines Mother   . Hyperlipidemia Father   . Diabetes Maternal Grandfather   . Lung disease Paternal Grandmother        ? etiology  . Heart disease Paternal Grandfather   . Stroke Paternal Grandfather   . Hypertension Paternal Grandfather   . Diabetes Paternal Uncle     Social History   Socioeconomic History  . Marital status: Significant Other    Spouse name: Not on file  . Number of children: Not on file  . Years of education: Not on file  . Highest education level: Bachelor's degree (e.g., BA, AB, BS)  Occupational History  . Not on file  Tobacco Use  . Smoking status: Never  . Smokeless tobacco: Never  Vaping Use  . Vaping status: Never Used  Substance and Sexual Activity  . Alcohol use: No    Alcohol/week: 0.0 standard drinks of alcohol  . Drug use: No  . Sexual activity: Not Currently    Partners: Male  Other Topics Concern  . Not on file  Social History Narrative   Enjoys volleyball   Goes to  Amber Whitney group   Has one sister   Lives with parents   College at [employer], Sociology major   One nurse, mental health.     Social Drivers of Corporate Investment Banker Strain: Low Risk  (05/29/2024)   Overall Financial Resource Strain (CARDIA)   . Difficulty of Paying Living Expenses: Not very hard  Food Insecurity: No Food Insecurity (05/29/2024)   Hunger Vital Sign   . Worried About Programme Researcher, Broadcasting/film/video in the Last Year: Never true   . Ran Out of Food in the Last Year: Never true  Transportation Needs: No Transportation Needs (05/29/2024)   PRAPARE - Transportation   . Lack of Transportation (Medical): No   . Lack of Transportation (Non-Medical): No  Physical Activity: Sufficiently Active (05/29/2024)   Exercise Vital Sign   . Days of Exercise per Week: 5 days   . Minutes of Exercise per Session: 30 min  Stress: Stress Concern Present (05/29/2024)   [employer] of Occupational Health - Occupational Stress Questionnaire   . Feeling of Stress: To some extent  Social Connections: Unknown (05/29/2024)   Social Connection and Isolation Panel   . Frequency of Communication with Friends and Family: Three times a week   .  Frequency of Social Gatherings with Friends and Family: Once a week   . Attends Religious Services: 1 to 4 times per year   . Active Member of Clubs or Organizations: No   . Attends Banker Meetings: Not on file   . Marital Status: Not on file  Intimate Partner Violence: Not on file    Outpatient Medications Prior to Visit  Medication Sig Dispense Refill  . cetirizine (ZYRTEC) 10 MG tablet Take 10 mg by mouth daily.    . escitalopram  (LEXAPRO ) 10 MG tablet Take 1 tablet (10 mg total) by mouth daily. 90 tablet 0  . HAILEY 1.5/30 1.5-30 MG-MCG tablet TAKE 1 TABLET BY MOUTH EVERY DAY 84 tablet 4   No facility-administered medications prior to visit.    No Known Allergies  ROS     Objective:    Physical Exam   BP 129/85 (BP Location: Left Arm,  Patient Position: Sitting, Cuff Size: Large)   Pulse (!) 109   Temp 98.6 F (37 C) (Oral)   Resp 16   Ht 5' 6 (1.676 m)   Wt 270 lb (122.5 kg)   SpO2 100%   BMI 43.58 kg/m  Wt Readings from Last 3 Encounters:  05/30/24 270 lb (122.5 kg)  08/27/23 247 lb (112 kg)  01/07/22 209 lb (94.8 kg)       Assessment & Plan:   Problem List Items Addressed This Visit   None Visit Diagnoses       Encounter for routine gynecological examination with Papanicolaou smear of cervix    -  Primary   Relevant Orders   Cytology - PAP( [place])       I am having Amber Whitney maintain her cetirizine, Hailey 1.5/30, and escitalopram .  No orders of the defined types were placed in this encounter.

## 2024-06-01 DIAGNOSIS — L304 Erythema intertrigo: Secondary | ICD-10-CM | POA: Insufficient documentation

## 2024-06-01 DIAGNOSIS — Z01419 Encounter for gynecological examination (general) (routine) without abnormal findings: Secondary | ICD-10-CM | POA: Insufficient documentation

## 2024-06-01 LAB — CYTOLOGY - PAP
Adequacy: ABSENT
Diagnosis: NEGATIVE

## 2024-06-01 NOTE — Patient Instructions (Signed)
 VISIT SUMMARY:  Today, you had a routine well woman visit, including a Pap smear, and we discussed your anxiety management and a rash under your arm.  YOUR PLAN:  WELL WOMAN VISIT: Routine well woman visit conducted. Pap smear performed. -We will send you the Pap smear results in a few days.  INTERTRIGO ( RASH): You have a rash under your breasts, likely due to heat exposure. -Apply antifungal powder daily after shower. -Use a hairdryer to dry the area after showering.  ANXIETY DISORDER: Your anxiety symptoms have improved recently due to starting a new job and establishing a routine. Your current Lexapro  dosage appears adequate. -Monitor your anxiety symptoms. -If your anxiety worsens, we may consider increasing your Lexapro  dosage. -Check back in 3-4 months for an anxiety assessment. -Reach out sooner if you have any concerns.

## 2024-06-01 NOTE — Assessment & Plan Note (Signed)
  Anxiety disorder with improved symptoms due to new job and routine. Current Lexapro  dosage appears adequate. Discussed potential for dosage increase if anxiety worsens. - Monitor anxiety symptoms. - Increase Lexapro  dosage if anxiety worsens. - Check back in 3-4 months for anxiety assessment. - Reach out sooner if concerns arise.

## 2024-06-01 NOTE — Assessment & Plan Note (Signed)
 Pap performed today.

## 2024-06-01 NOTE — Assessment & Plan Note (Signed)
  Intertrigo under breasts. She is using antifungal powder. - Use hairdryer to dry the area after shower then apply antifungal powder daily.

## 2024-06-02 ENCOUNTER — Ambulatory Visit: Payer: Self-pay | Admitting: Family

## 2024-06-02 NOTE — Telephone Encounter (Signed)
 See mychart.

## 2024-08-30 ENCOUNTER — Encounter: Payer: Self-pay | Admitting: Family

## 2024-08-30 DIAGNOSIS — F411 Generalized anxiety disorder: Secondary | ICD-10-CM

## 2024-08-30 MED ORDER — ESCITALOPRAM OXALATE 10 MG PO TABS: 10.0000 mg | ORAL_TABLET | Freq: Every day | ORAL | 0 refills | Status: AC
